# Patient Record
Sex: Female | Born: 1975 | Race: White | Hispanic: No | Marital: Married | Smoking: Current some day smoker
Health system: Southern US, Community
[De-identification: ages and names within clinical notes are randomized; demographics above are authoritative.]

---

## 2002-10-29 ENCOUNTER — Other Ambulatory Visit: Admission: RE | Admit: 2002-10-29 | Discharge: 2002-10-29 | Payer: Self-pay | Admitting: Obstetrics and Gynecology

## 2003-11-08 ENCOUNTER — Other Ambulatory Visit: Admission: RE | Admit: 2003-11-08 | Discharge: 2003-11-08 | Payer: Self-pay | Admitting: Obstetrics and Gynecology

## 2004-06-23 ENCOUNTER — Inpatient Hospital Stay (HOSPITAL_COMMUNITY): Admission: AD | Admit: 2004-06-23 | Discharge: 2004-06-23 | Payer: Self-pay | Admitting: *Deleted

## 2004-07-18 ENCOUNTER — Inpatient Hospital Stay (HOSPITAL_COMMUNITY): Admission: AD | Admit: 2004-07-18 | Discharge: 2004-07-21 | Payer: Self-pay | Admitting: Pediatrics

## 2004-09-05 ENCOUNTER — Other Ambulatory Visit: Admission: RE | Admit: 2004-09-05 | Discharge: 2004-09-05 | Payer: Self-pay | Admitting: Obstetrics and Gynecology

## 2005-11-12 ENCOUNTER — Other Ambulatory Visit: Admission: RE | Admit: 2005-11-12 | Discharge: 2005-11-12 | Payer: Self-pay | Admitting: Obstetrics and Gynecology

## 2008-03-10 ENCOUNTER — Inpatient Hospital Stay (HOSPITAL_COMMUNITY): Admission: AD | Admit: 2008-03-10 | Discharge: 2008-03-10 | Payer: Self-pay | Admitting: Obstetrics and Gynecology

## 2008-03-16 ENCOUNTER — Inpatient Hospital Stay (HOSPITAL_COMMUNITY): Admission: AD | Admit: 2008-03-16 | Discharge: 2008-03-16 | Payer: Self-pay | Admitting: Obstetrics and Gynecology

## 2008-03-22 ENCOUNTER — Inpatient Hospital Stay (HOSPITAL_COMMUNITY): Admission: AD | Admit: 2008-03-22 | Discharge: 2008-03-24 | Payer: Self-pay | Admitting: Obstetrics and Gynecology

## 2011-03-06 NOTE — H&P (Signed)
Lori Hill, Lori Hill                ACCOUNT NO.:  [id number]   MEDICAL RECORD NO.:  [id number]          PATIENT TYPE:  INP   LOCATION:  9166                          FACILITY:  [facility]   PHYSICIAN:  Duke Salvia. Marcelle Overlie, M.D.DATE OF BIRTH:  1976/02/07   DATE OF ADMISSION:  03/22/2008  DATE OF DISCHARGE:                              HISTORY & PHYSICAL   CHIEF COMPLAINT:  For labor induction.   HISTORY OF PRESENT ILLNESS:  a 35 year old G2, P 1-0-0-1, EDD is April 16, 2008, EGA at 36-4/7 weeks.  The patient was seen by Dr. Rana Snare in the  office earlier today and was noted to have BP 148/100, recheck 140/94.  Urine was negative for protein.  She was complaining of edema, NST was  reactive.  Dr. Rana Snare has admitted her for labor induction for her blood  pressure.   GBS is positive.  She is not currently on any medications except for  prenatal vitamins.  She does have a history of PIH with her first  pregnancy.  Blood type is A+.  Rubella titer is immune.   PAST MEDICAL HISTORY:  Please see the [facility] for details.  She is  allergic to SULFA.  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  She has been on Valtrex once daily for prophylaxis.   Her father does have a history of hypertension.   PHYSICAL EXAMINATION:  VITAL SIGNS:  Temperature 98.2, blood pressure  140/94.  HEENT: Unremarkable.  NECK:  Supple, without masses.  LUNGS:  Clear.  CARDIOVASCULAR:  Regular rate and rhythm, without murmurs, rubs, or  gallops.  BREASTS:  Not examined.  ABDOMEN:  37 cm fundal height.  Fetal heart rate 140.  PELVIC:  Cervix was 2, 25%, vertex high.  EXTREMITIES:  Revealed 2+ edema.   IMPRESSION:  1. 36-4/7 week pregnancy.  2. Pregnancy-induced hypertension.   PLAN:  Admit for induction per Dr. Rana Snare.      Richard M. Marcelle Overlie, M.D.  Electronically Signed     [facility]/MEDQ  D:  03/22/2008  T:  03/22/2008  Job:  [id number]

## 2011-03-06 NOTE — H&P (Signed)
Lori Hill, Lori Hill                ACCOUNT NO.:  [id number]   MEDICAL RECORD NO.:  [id number]          PATIENT TYPE:  INP   LOCATION:  9166                          FACILITY:  [facility]   PHYSICIAN:  Duke Salvia. Marcelle Overlie, M.D.DATE OF BIRTH:  1975-12-12   DATE OF ADMISSION:  03/22/2008  DATE OF DISCHARGE:                              HISTORY & PHYSICAL   Dictation ended at this point.      Richard M. Marcelle Overlie, M.D.     [facility]/MEDQ  D:  03/22/2008  T:  03/22/2008  Job:  [id number]

## 2011-07-18 LAB — CBC
HCT: 30.4 — ABNORMAL LOW
Hemoglobin: 11 — ABNORMAL LOW
MCHC: 34.8
Platelets: 276
Platelets: 301
RBC: 3.42 — ABNORMAL LOW
RDW: 13.8

## 2011-07-18 LAB — COMPREHENSIVE METABOLIC PANEL
ALT: 19
AST: 19
Albumin: 2.2 — ABNORMAL LOW
Alkaline Phosphatase: 192 — ABNORMAL HIGH
BUN: 4 — ABNORMAL LOW
BUN: 5 — ABNORMAL LOW
CO2: 24
Calcium: 8.8
Creatinine, Ser: 0.45
GFR calc Af Amer: >60
GFR calc Af Amer: >60
GFR calc non Af Amer: >60
GFR calc non Af Amer: >60
Potassium: 3 — ABNORMAL LOW
Total Bilirubin: 0.3
Total Bilirubin: 0.3

## 2011-07-18 LAB — URINALYSIS, ROUTINE W REFLEX MICROSCOPIC
Nitrite: NEGATIVE
Protein, ur: NEGATIVE
Specific Gravity, Urine: 1.01
Urobilinogen, UA: 0.2

## 2011-07-18 LAB — URIC ACID: Uric Acid, Serum: 3.6

## 2011-07-18 LAB — LACTATE DEHYDROGENASE
LDH: 156
LDH: 162

## 2011-07-18 LAB — URINE MICROSCOPIC-ADD ON

## 2011-07-19 LAB — CBC
HCT: 32.2 — ABNORMAL LOW
MCHC: 34.7
MCHC: 35.2
MCV: 89.8
RBC: 3.08 — ABNORMAL LOW
RBC: 3.61 — ABNORMAL LOW
RDW: 13.9
WBC: 10

## 2011-07-19 LAB — COMPREHENSIVE METABOLIC PANEL
Albumin: 2.3 — ABNORMAL LOW
CO2: 23
Chloride: 106
Sodium: 137
Total Protein: 5.5 — ABNORMAL LOW

## 2011-07-19 LAB — LACTATE DEHYDROGENASE: LDH: 183

## 2012-11-02 ENCOUNTER — Emergency Department (HOSPITAL_COMMUNITY)
Admission: EM | Admit: 2012-11-02 | Discharge: 2012-11-02 | Disposition: A | Discharge status: Home / Self Care | Payer: BC Managed Care – PPO | Attending: Emergency Medicine | Admitting: Emergency Medicine

## 2012-11-02 ENCOUNTER — Encounter (HOSPITAL_COMMUNITY): Payer: Self-pay | Admitting: Emergency Medicine

## 2012-11-02 ENCOUNTER — Emergency Department (HOSPITAL_COMMUNITY): Payer: BC Managed Care – PPO

## 2012-11-02 DIAGNOSIS — Z3202 Encounter for pregnancy test, result negative: Secondary | ICD-10-CM | POA: Insufficient documentation

## 2012-11-02 DIAGNOSIS — Z79899 Other long term (current) drug therapy: Secondary | ICD-10-CM | POA: Insufficient documentation

## 2012-11-02 DIAGNOSIS — N201 Calculus of ureter: Secondary | ICD-10-CM | POA: Insufficient documentation

## 2012-11-02 DIAGNOSIS — N23 Unspecified renal colic: Secondary | ICD-10-CM | POA: Insufficient documentation

## 2012-11-02 DIAGNOSIS — R112 Nausea with vomiting, unspecified: Secondary | ICD-10-CM | POA: Insufficient documentation

## 2012-11-02 DIAGNOSIS — F17200 Nicotine dependence, unspecified, uncomplicated: Secondary | ICD-10-CM | POA: Insufficient documentation

## 2012-11-02 LAB — COMPREHENSIVE METABOLIC PANEL
Alkaline Phosphatase: 70 U/L (ref 39–117)
Chloride: 106 mEq/L (ref 96–112)
GFR calc Af Amer: >90 mL/min (ref >90)
Sodium: 141 mEq/L (ref 135–145)
Total Protein: 7 g/dL (ref 6.0–8.3)

## 2012-11-02 LAB — CBC WITH DIFFERENTIAL/PLATELET
Basophils Relative: 0 % (ref 0–1)
Eosinophils Absolute: 0 10*3/uL (ref 0.0–0.7)
Hemoglobin: 13.8 g/dL (ref 12.0–15.0)
Lymphocytes Relative: 9 % — ABNORMAL LOW (ref 12–46)
MCHC: 35.6 g/dL (ref 30.0–36.0)
MCV: 88 fL (ref 78.0–100.0)
Monocytes Absolute: 0.4 10*3/uL (ref 0.1–1.0)
Monocytes Relative: 3 % (ref 3–12)
Neutro Abs: 9.8 10*3/uL — ABNORMAL HIGH (ref 1.7–7.7)
Platelets: 313 10*3/uL (ref 150–400)
RBC: 4.41 MIL/uL (ref 3.87–5.11)
RDW: 11.6 % (ref 11.5–15.5)

## 2012-11-02 LAB — URINALYSIS, ROUTINE W REFLEX MICROSCOPIC
Ketones, ur: 40 mg/dL — AB
Nitrite: NEGATIVE
Protein, ur: 30 mg/dL — AB
Urobilinogen, UA: 1 mg/dL (ref 0.0–1.0)

## 2012-11-02 LAB — URINE MICROSCOPIC-ADD ON

## 2012-11-02 LAB — LIPASE, BLOOD: Lipase: 26 U/L (ref 11–59)

## 2012-11-02 MED ORDER — OXYCODONE-ACETAMINOPHEN 5-325 MG PO TABS
2.0000 | ORAL_TABLET | Freq: Once | ORAL | Status: AC
  Administered 2012-11-02: 2 via ORAL
  Filled 2012-11-02: qty 2

## 2012-11-02 MED ORDER — ONDANSETRON 4 MG PO TBDP: 4.0000 mg | ORAL_TABLET | Freq: Three times a day (TID) | ORAL | Status: DC | PRN

## 2012-11-02 MED ORDER — MORPHINE SULFATE 4 MG/ML IJ SOLN
4.0000 mg | INTRAMUSCULAR | Status: DC | PRN
  Administered 2012-11-02: 4 mg via INTRAVENOUS
  Filled 2012-11-02: qty 1

## 2012-11-02 MED ORDER — ONDANSETRON HCL 4 MG/2ML IJ SOLN
4.0000 mg | INTRAMUSCULAR | Status: AC | PRN
  Administered 2012-11-02 (×2): 4 mg via INTRAVENOUS
  Filled 2012-11-02 (×2): qty 2

## 2012-11-02 MED ORDER — MORPHINE SULFATE 4 MG/ML IJ SOLN
4.0000 mg | INTRAMUSCULAR | Status: AC | PRN
  Administered 2012-11-02 (×2): 4 mg via INTRAVENOUS
  Filled 2012-11-02 (×2): qty 1

## 2012-11-02 MED ORDER — SODIUM CHLORIDE 0.9 % IV SOLN
INTRAVENOUS | Status: DC
  Administered 2012-11-02: 11:00:00 via INTRAVENOUS

## 2012-11-02 MED ORDER — NAPROXEN 250 MG PO TABS: 250.0000 mg | ORAL_TABLET | Freq: Two times a day (BID) | ORAL | Status: DC

## 2012-11-02 MED ORDER — KETOROLAC TROMETHAMINE 30 MG/ML IJ SOLN
30.0000 mg | Freq: Once | INTRAMUSCULAR | Status: AC
  Administered 2012-11-02: 30 mg via INTRAVENOUS
  Filled 2012-11-02: qty 1

## 2012-11-02 MED ORDER — OXYCODONE-ACETAMINOPHEN 5-325 MG PO TABS: ORAL_TABLET | ORAL | Status: DC

## 2012-11-02 MED ORDER — TAMSULOSIN HCL 0.4 MG PO CAPS: 0.4000 mg | ORAL_CAPSULE | Freq: Every day | ORAL | Status: DC

## 2012-11-02 NOTE — ED Notes (Signed)
Pt c/o right flank pain with n/v onset this morning. Pt has been dry heaving on the ride here.

## 2012-11-02 NOTE — ED Provider Notes (Signed)
History     CSN: [id number]  Arrival date & time 11/02/12  4098   First MD Initiated Contact with Patient 11/02/12 1003      Chief Complaint  Patient presents with  . Flank Pain  . Nausea  . Emesis    HPI Pt was seen at 1020.   Per pt, c/o sudden onset and persistence of constant right sided flank "pain" that began this morning PTA.  Has been associated with several intermittent episodes of N/V.  Denies hx of same. Denies abd pain, no diarrhea, no dysuria/hematuria, no vaginal bleeding/discharge, no CP/SOB, no fevers.    History reviewed. No pertinent past medical history.  History reviewed. No pertinent past surgical history.   History  Substance Use Topics  . Smoking status: Current Some Day Smoker  . Smokeless tobacco: Not on file  . Alcohol Use: Yes    Review of Systems ROS: Statement: All systems negative except as marked or noted in the HPI; Constitutional: Negative for fever and chills. ; ; Eyes: Negative for eye pain, redness and discharge. ; ; ENMT: Negative for ear pain, hoarseness, nasal congestion, sinus pressure and sore throat. ; ; Cardiovascular: Negative for chest pain, palpitations, diaphoresis, dyspnea and peripheral edema. ; ; Respiratory: Negative for cough, wheezing and stridor. ; ; Gastrointestinal: +N/V. Negative for diarrhea, abdominal pain, blood in stool, hematemesis, jaundice and rectal bleeding. . ; ; Genitourinary: +flank pain. Negative for dysuria and hematuria. ; ; GYN:  No vaginal bleeding, no vaginal discharge, no vulvar pain.;; Musculoskeletal: Negative for back pain and neck pain. Negative for swelling and trauma.; ; Skin: Negative for pruritus, rash, abrasions, blisters, bruising and skin lesion.; ; Neuro: Negative for headache, lightheadedness and neck stiffness. Negative for weakness, altered level of consciousness , altered mental status, extremity weakness, paresthesias, involuntary movement, seizure and syncope.       Allergies  Review  of patient's allergies indicates not on file.  Home Medications  No current outpatient prescriptions on file.  BP 144/86  Pulse 77  Temp 97.9 F (36.6 C) (Oral)  Resp 24  SpO2 98%  Physical Exam 1025:  Physical examination:  Nursing notes reviewed; Vital signs and O2 SAT reviewed;  Constitutional: Well developed, Well nourished, Well hydrated, Uncomfortable appearing; Head:  Normocephalic, atraumatic; Eyes: EOMI, PERRL, No scleral icterus; ENMT: Mouth and pharynx normal, Mucous membranes moist; Neck: Supple, Full range of motion, No lymphadenopathy; Cardiovascular: Regular rate and rhythm, No murmur, rub, or gallop; Respiratory: +hyperventilating. Breath sounds clear & equal bilaterally, No rales, rhonchi, wheezes.  Speaking full sentences with ease, No resp distress.; Chest: Nontender, Movement normal; Abdomen: +dry heaving during exam. Soft, Nontender, Nondistended, Normal bowel sounds; Genitourinary: No CVA tenderness; Spine:  No midline CS, TS, LS tenderness. No rash;; Extremities: Pulses normal, No tenderness, No edema, No calf edema or asymmetry.; Neuro: AA&Ox3, Major CN grossly intact.  Speech clear. No gross focal motor or sensory deficits in extremities.; Skin: Color normal, Warm, Dry.    ED Course  Procedures     MDM  MDM Reviewed: nursing note and vitals Interpretation: labs and CT scan   Results for orders placed during the hospital encounter of 11/02/12  COMPREHENSIVE METABOLIC PANEL      Component Value Range   Sodium 141  135 - 145 mEq/L   Potassium 3.7  3.5 - 5.1 mEq/L   Chloride 106  96 - 112 mEq/L   CO2 18 (*) 19 - 32 mEq/L   Glucose, Bld 124 (*) 70 -  99 mg/dL   BUN 19  6 - 23 mg/dL   Creatinine, Ser 1.61  0.50 - 1.10 mg/dL   Calcium 9.9  8.4 - 09.6 mg/dL   Total Protein 7.0  6.0 - 8.3 g/dL   Albumin 4.2  3.5 - 5.2 g/dL   AST 19  0 - 37 U/L   ALT 21  0 - 35 U/L   Alkaline Phosphatase 70  39 - 117 U/L   Total Bilirubin 0.5  0.3 - 1.2 mg/dL   GFR calc non  Af Amer >90  >90 mL/min   GFR calc Af Amer >90  >90 mL/min  LIPASE, BLOOD      Component Value Range   Lipase 26  11 - 59 U/L  CBC WITH DIFFERENTIAL      Component Value Range   WBC 11.3 (*) 4.0 - 10.5 K/uL   RBC 4.41  3.87 - 5.11 MIL/uL   Hemoglobin 13.8  12.0 - 15.0 g/dL   HCT 04.5  40.9 - 81.1 %   MCV 88.0  78.0 - 100.0 fL   MCH 31.3  26.0 - 34.0 pg   MCHC 35.6  30.0 - 36.0 g/dL   RDW 91.4  78.2 - 95.6 %   Platelets 313  150 - 400 K/uL   Neutrophils Relative 87 (*) 43 - 77 %   Neutro Abs 9.8 (*) 1.7 - 7.7 K/uL   Lymphocytes Relative 9 (*) 12 - 46 %   Lymphs Abs 1.0  0.7 - 4.0 K/uL   Monocytes Relative 3  3 - 12 %   Monocytes Absolute 0.4  0.1 - 1.0 K/uL   Eosinophils Relative 0  0 - 5 %   Eosinophils Absolute 0.0  0.0 - 0.7 K/uL   Basophils Relative 0  0 - 1 %   Basophils Absolute 0.0  0.0 - 0.1 K/uL  URINALYSIS, ROUTINE W REFLEX MICROSCOPIC      Component Value Range   Color, Urine Jamice (*) YELLOW   APPearance CLOUDY (*) CLEAR   Specific Gravity, Urine 1.031 (*) 1.005 - 1.030   pH 8.0  5.0 - 8.0   Glucose, UA NEGATIVE  NEGATIVE mg/dL   Hgb urine dipstick LARGE (*) NEGATIVE   Bilirubin Urine NEGATIVE  NEGATIVE   Ketones, ur 40 (*) NEGATIVE mg/dL   Protein, ur 30 (*) NEGATIVE mg/dL   Urobilinogen, UA 1.0  0.0 - 1.0 mg/dL   Nitrite NEGATIVE  NEGATIVE   Leukocytes, UA SMALL (*) NEGATIVE  PREGNANCY, URINE      Component Value Range   Preg Test, Ur NEGATIVE  NEGATIVE  URINE MICROSCOPIC-ADD ON      Component Value Range   Squamous Epithelial / LPF MANY (*) RARE   WBC, UA 3-6  <3 WBC/hpf   RBC / HPF TOO NUMEROUS TO COUNT  <3 RBC/hpf   Bacteria, UA FEW (*) RARE   Ct Abdomen Pelvis Wo Contrast 11/02/2012  *RADIOLOGY REPORT*  Clinical Data: Right flank pain, nausea, vomiting  CT ABDOMEN AND PELVIS WITHOUT CONTRAST  Technique:  Multidetector CT imaging of the abdomen and pelvis was performed following the standard protocol without intravenous contrast.  Comparison: None.   Findings: Minimal dependent basilar atelectasis.  Normal heart size.  No pericardial or pleural effusion.  Abdomen: Moderate right hydronephrosis and proximal hydroureter. Perinephric inflammatory stranding noted about the right kidney and a small amount of peri nephric free fluid.  This appears secondary to 5 mm right distal ureteral obstructing calculus just  above the right UVJ, image 70.  This is also confirmed on the coronal image, image 71.  Left kidney demonstrates no acute process.  Punctate nonobstructing tiny renal calculi noted throughout the left kidney.  Liver, gallbladder, biliary system, pancreas, spleen, and adrenal glands are within normal limits for noncontrast exam.  Negative for bowel obstruction, dilatation, ileus pattern, or free air.  No abdominal free fluid, fluid collection, hemorrhage, hematoma, or adenopathy.  Pelvis:  Trace pelvic free fluid.  IUD noted within a retroverted uterus.  Low lying cecum noted extending into the pelvis.  No pelvic hemorrhage, abscess, adenopathy, inguinal abnormality, and or acute distal bowel process.  No acute osseous finding.  IMPRESSION: 5 mm right distal ureteral obstructing calculus with associated proximal moderate right hydronephrosis.  Punctate nonobstructing left nephrolithiasis   Original Report Authenticated By: Judie Petit. Shick, M.D.     1445:  Pt states she feels "much better now" and wants to go home.  No further N/V after meds.  Has tol PO well.  Dx and testing d/w pt and family.  Questions answered.  Verb understanding, agreeable to d/c home with outpt f/u.         No midlevel was involved in the care of this pt. Woodrow Drab Allison Quarry, DO 11/03/12 1245

## 2012-11-03 LAB — URINE CULTURE: Colony Count: >=100000

## 2013-08-05 IMAGING — CT CT ABD-PELV W/O CM
2 of 4 series · 17 of 46 positions shown, 19 images · non-contrast
Comparison: None.

CLINICAL DATA: Right flank pain, nausea, vomiting

CT ABDOMEN AND PELVIS WITHOUT CONTRAST
TECHNIQUE: Multidetector CT imaging of the abdomen and pelvis was
performed following the standard protocol without intravenous
contrast.

[Series 2: stone >200 lbs 5.0 b31f st · axial · 0.69mm/px · z∈[-417,-37]mm · 14 of 84 slices shown, 16 images]
[im 4/84  soft-tissue]
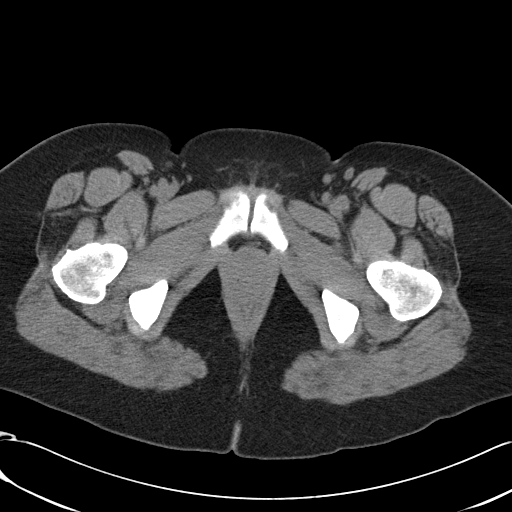
[im 4/84  bone]
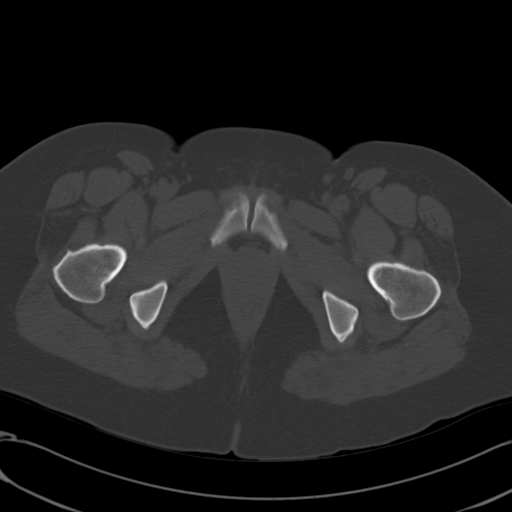
[im 10/84  soft-tissue]
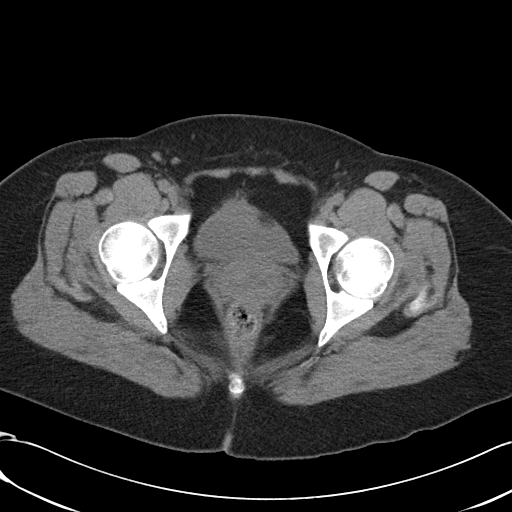
[im 17/84  soft-tissue]
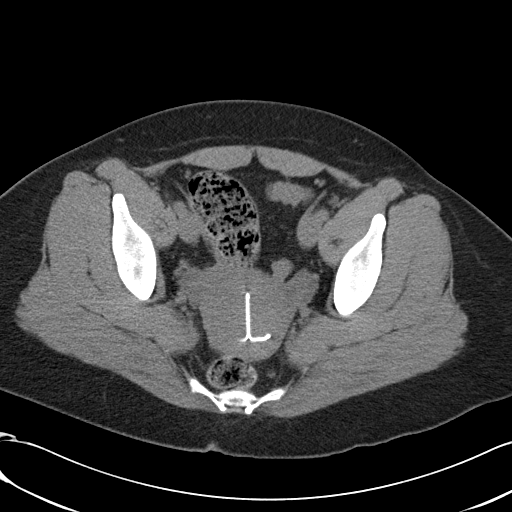
[im 24/84  soft-tissue]
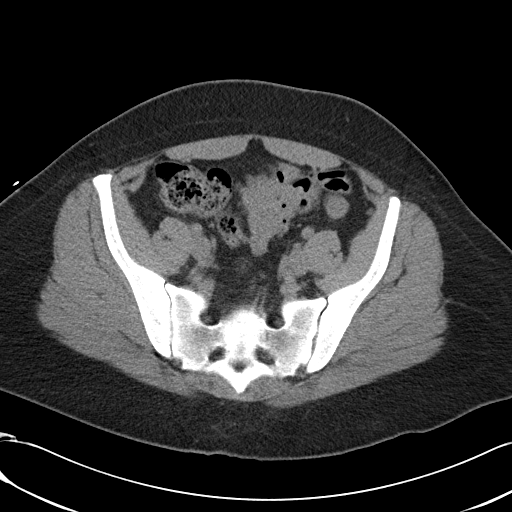
[im 27/84  soft-tissue]
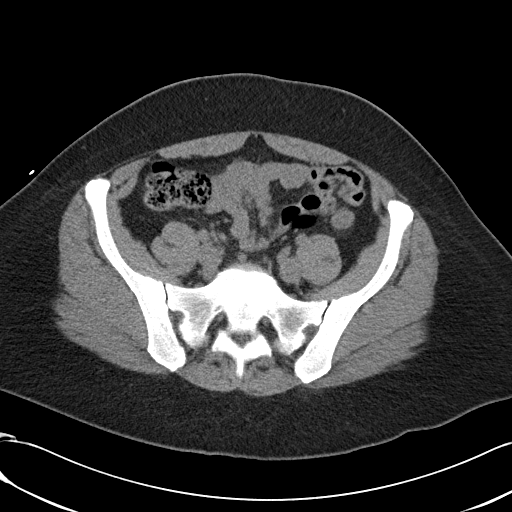
[im 34/84  soft-tissue]
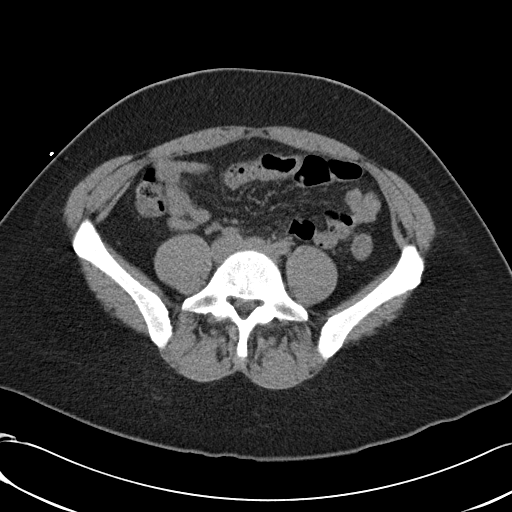
[im 40/84  soft-tissue]
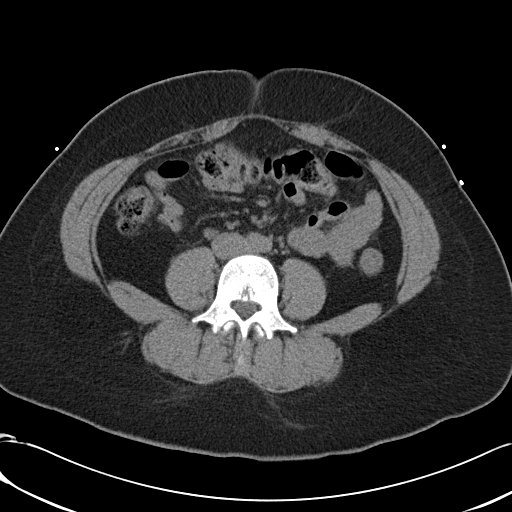
[im 44/84  soft-tissue]
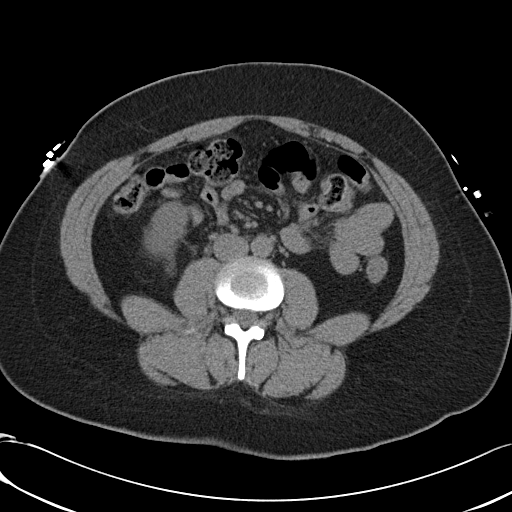
[im 50/84  soft-tissue]
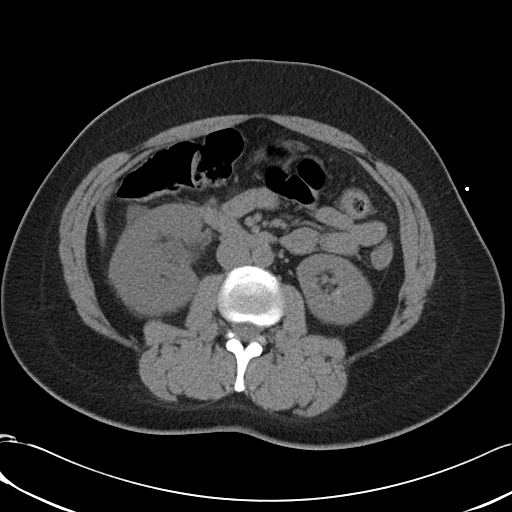
[im 50/84  bone]
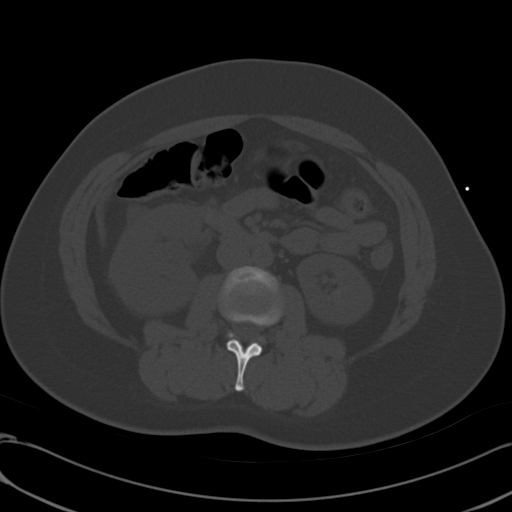
[im 57/84  soft-tissue]
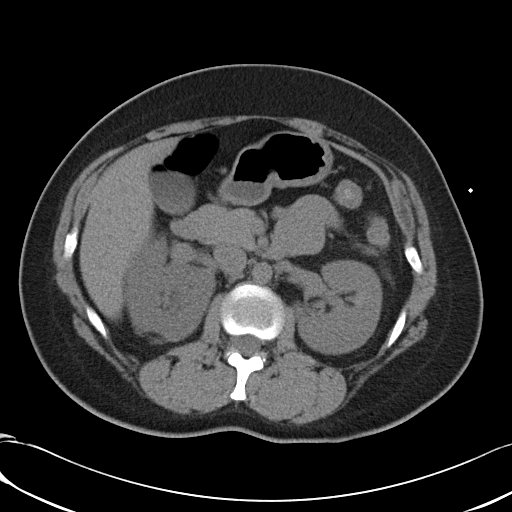
[im 64/84  soft-tissue]
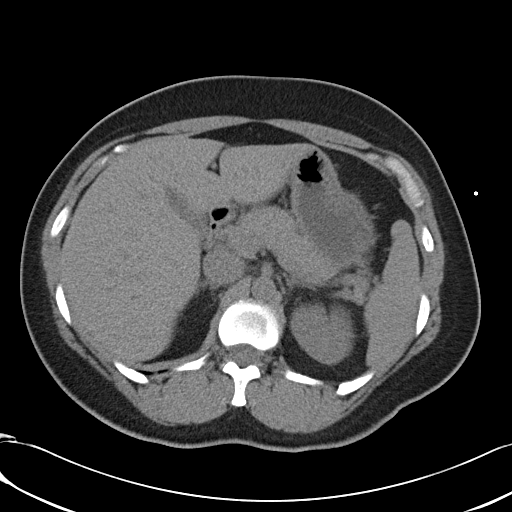
[im 67/84  soft-tissue]
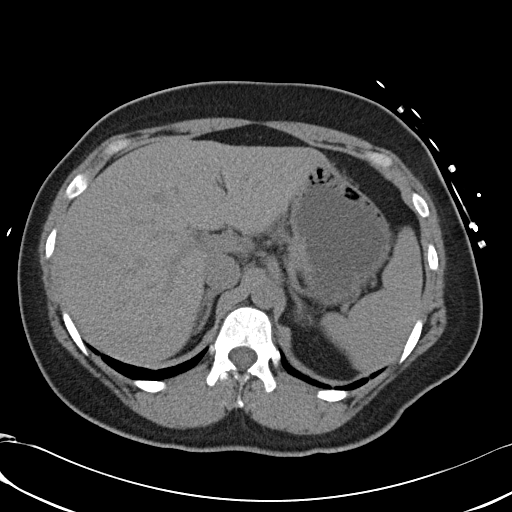
[im 74/84  soft-tissue]
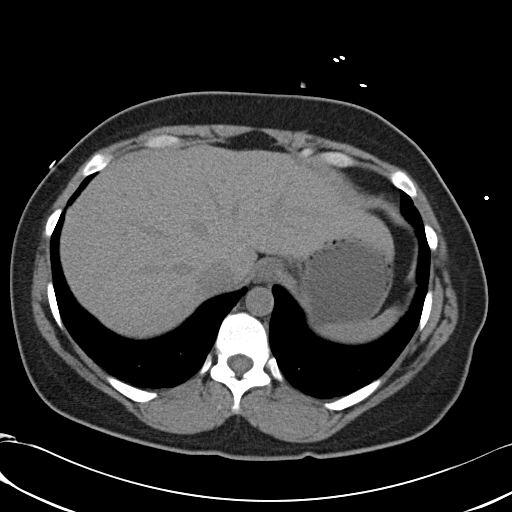
[im 80/84  soft-tissue]
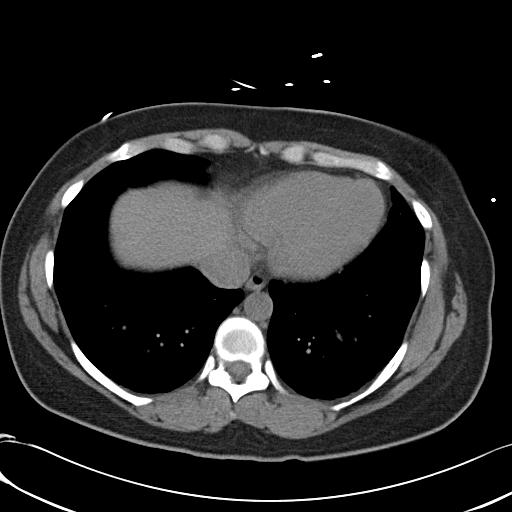

[Series 5: coronals · coronal · 0.91mm/px · 3 of 119 slices shown]
[im 40/119  soft-tissue]
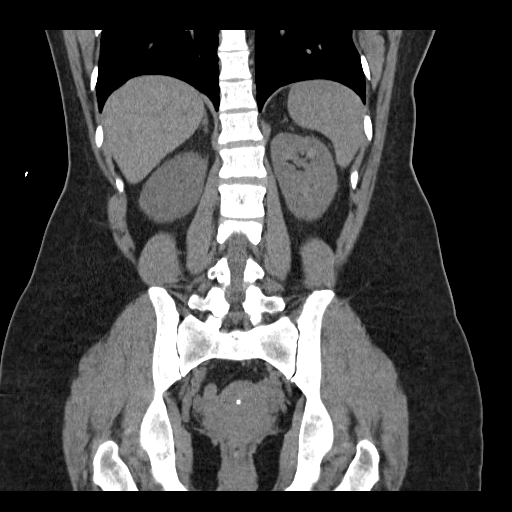
[im 53/119  soft-tissue]
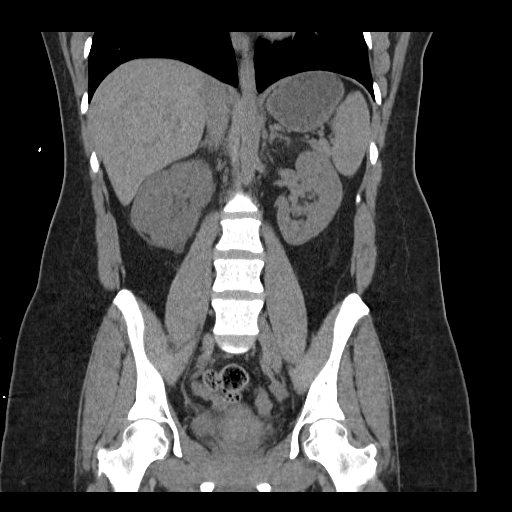
[im 66/119  soft-tissue]
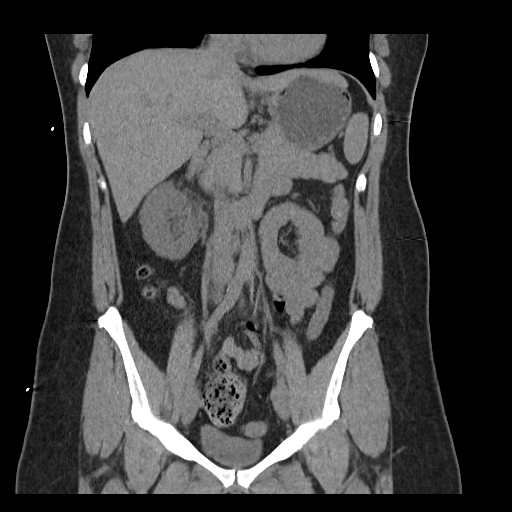

[17 of 46 positions shown; findings below may reference images not displayed]

FINDINGS: Minimal dependent basilar atelectasis.  Normal heart
size.  No pericardial or pleural effusion.

Abdomen: Moderate right hydronephrosis and proximal hydroureter.
Perinephric inflammatory stranding noted about the right kidney and
a small amount of peri nephric free fluid.  This appears secondary
to 5 mm right distal ureteral obstructing calculus just above the
right UVJ, image 70.  This is also confirmed on the coronal image,
image 71.

Left kidney demonstrates no acute process.  Punctate nonobstructing
tiny renal calculi noted throughout the left kidney.

Liver, gallbladder, biliary system, pancreas, spleen, and adrenal
glands are within normal limits for noncontrast exam.

Negative for bowel obstruction, dilatation, ileus pattern, or free
air.

No abdominal free fluid, fluid collection, hemorrhage, hematoma, or
adenopathy.

Pelvis:  Trace pelvic free fluid.  IUD noted within a retroverted
uterus.  Low lying cecum noted extending into the pelvis.  No
pelvic hemorrhage, abscess, adenopathy, inguinal abnormality, and
or acute distal bowel process.

No acute osseous finding.
IMPRESSION: 5 mm right distal ureteral obstructing calculus with associated
proximal moderate right hydronephrosis.

Punctate nonobstructing left nephrolithiasis

## 2016-05-04 ENCOUNTER — Encounter: Payer: Self-pay | Admitting: Podiatry

## 2016-05-04 ENCOUNTER — Ambulatory Visit (INDEPENDENT_AMBULATORY_CARE_PROVIDER_SITE_OTHER): Payer: BLUE CROSS/BLUE SHIELD | Admitting: Podiatry

## 2016-05-04 ENCOUNTER — Ambulatory Visit (INDEPENDENT_AMBULATORY_CARE_PROVIDER_SITE_OTHER): Payer: BLUE CROSS/BLUE SHIELD

## 2016-05-04 VITALS — BP 131/82 | HR 78 | Resp 12

## 2016-05-04 DIAGNOSIS — S93401S Sprain of unspecified ligament of right ankle, sequela: Secondary | ICD-10-CM | POA: Diagnosis not present

## 2016-05-04 DIAGNOSIS — M79671 Pain in right foot: Secondary | ICD-10-CM

## 2016-05-04 MED ORDER — MELOXICAM 15 MG PO TABS: 15.0000 mg | ORAL_TABLET | Freq: Every day | ORAL | Status: DC

## 2016-05-04 NOTE — Patient Instructions (Signed)

## 2016-05-04 NOTE — Progress Notes (Signed)
   Subjective:    Patient ID: Lori Hill, female    DOB: 16-Feb-1976, 40 y.o.   MRN: [id number]  HPI Chief Complaint  Patient presents with  . Foot Pain    RT FOOT ANKLE IS BEEN PAINFUL/TENDER  FOR 6 MONTHS. ANKLE IS WORSE/SORE WHEN PUTTING PRESSURE. TRIED NO TREATMENT.    40 year old female presents the also concerns for right ankle pain which is been ongoing for about 6 months as worse as she tries to put a lot of pressure to her foot. She previously had no previous treatment. Back in February she did roll her ankle with walking but she did not seek treatment at that time. She states that it never got better after that time. It does swell occasionally but denies any redness or any warmth. Denies any redness or tingling. The pain does not wake up in 9. No other complaints at this time.  Review of Systems  Musculoskeletal: Positive for gait problem.       Objective:   Physical Exam General: AAO x3, NAD  Dermatological: Skin is warm, dry and supple bilateral. Nails x 10 are well manicured; remaining integument appears unremarkable at this time. There are no open sores, no preulcerative lesions, no rash or signs of infection present.  Vascular: Dorsalis Pedis artery and Posterior Tibial artery pedal pulses are 2/4 bilateral with immedate capillary fill time. Pedal hair growth present. There is no pain with calf compression, swelling, warmth, erythema.   Neruologic: Grossly intact via light touch bilateral. Vibratory intact via tuning fork bilateral.Patellar and Achilles deep tendon reflexes 2+ bilateral. No Babinski or clonus noted bilateral.   Musculoskeletal:There is tenderness on the course the ATFL and CFL the right ankle. There is no gross instability noted of the ankle. There is no area pinpoint bony tenderness. There is no pain with ankle or subtalar joint range of motion. There does appear to be some chronic edema overlying the course the ATFL any significant erythema or increase  in warmth. There is no other areas of tenderness bilaterally. MMT 5/5.    Assessment: 40 year old female right chronic ankle sprain  Plan: -Treatment options discussed including all alternatives, risks, and complications -Etiology of symptoms were discussed -X-rays were obtained and reviewed with the patient. No evidence of acute fracture or stress fracture. -Prescribed mobic. Discussed side effects of the medication and directed to stop if any are to occur and call the office.  -Dispensed ankle brace -Rehabilitation exercises for ankle sprain -Follow-up in 4 weeks. The time of symptoms continue will likely perform a steroid injection. In the meantime call any questions or concerns or any change in symptoms.  Ovid CurdMatthew Wagoner, DPM

## 2016-05-17 DIAGNOSIS — S93409A Sprain of unspecified ligament of unspecified ankle, initial encounter: Secondary | ICD-10-CM | POA: Insufficient documentation

## 2016-06-08 ENCOUNTER — Ambulatory Visit: Payer: BLUE CROSS/BLUE SHIELD | Admitting: Podiatry

## 2016-06-18 ENCOUNTER — Ambulatory Visit (INDEPENDENT_AMBULATORY_CARE_PROVIDER_SITE_OTHER): Payer: BLUE CROSS/BLUE SHIELD | Admitting: Podiatry

## 2016-06-18 ENCOUNTER — Encounter: Payer: Self-pay | Admitting: Podiatry

## 2016-06-18 ENCOUNTER — Telehealth: Payer: Self-pay | Admitting: *Deleted

## 2016-06-18 DIAGNOSIS — M79671 Pain in right foot: Secondary | ICD-10-CM

## 2016-06-18 DIAGNOSIS — S93401S Sprain of unspecified ligament of right ankle, sequela: Secondary | ICD-10-CM

## 2016-06-18 NOTE — Telephone Encounter (Signed)
[phone]

## 2016-06-18 NOTE — Progress Notes (Signed)
Subjective: 40 year old female presents the office for follow-up evaluation of right ankle pain. She states that the pain has improved although does continue to swell and become painful when she does not wear the ankle brace. This been ongoing for several months and she had a sprain but 6 months prior to coming to the office. Denies any systemic complaints such as fevers, chills, nausea, vomiting. No acute changes since last appointment, and no other complaints at this time.   Objective: AAO x3, NAD DP/PT pulses palpable bilaterally, CRT less than 3 seconds There is tenderness along the lateral aspect of the ankle on the course the ATFL and CFL and to a lesser degree the pain nail tendon. There is no gross ankle instability present. There is no area pinpoint bony tenderness or pain the vibratory sensation. Ankle, subtalar joint range of motion intact. No edema, erythema, increase in warmth to bilateral lower extremities.  No open lesions or pre-ulcerative lesions.  No pain with calf compression, swelling, warmth, erythema  Assessment: Chronic ankle sprain right side  Plan: -All treatment options discussed with the patient including all alternatives, risks, complications.  -At this time given the continued pain and swelling recommended physical therapy. This was prescribed to her today. Continue anti-inflammatories which she has not been taking. Continue ankle brace as needed. Monitor pain subsides to decrease wearing the ankle brace. If symptoms continue despite physical therapy will obtain an MRI. In the meantime call any questions or concerns. See her after physical therapy.   Ovid CurdMatthew Wagoner, DPM

## 2017-02-01 ENCOUNTER — Ambulatory Visit (INDEPENDENT_AMBULATORY_CARE_PROVIDER_SITE_OTHER): Payer: BLUE CROSS/BLUE SHIELD

## 2017-02-01 ENCOUNTER — Ambulatory Visit (INDEPENDENT_AMBULATORY_CARE_PROVIDER_SITE_OTHER): Payer: BLUE CROSS/BLUE SHIELD | Admitting: Podiatry

## 2017-02-01 ENCOUNTER — Encounter: Payer: Self-pay | Admitting: Podiatry

## 2017-02-01 DIAGNOSIS — M722 Plantar fascial fibromatosis: Secondary | ICD-10-CM | POA: Diagnosis not present

## 2017-02-01 MED ORDER — MELOXICAM 15 MG PO TABS: 15.0000 mg | ORAL_TABLET | Freq: Every day | ORAL | 2 refills | Status: AC

## 2017-02-01 NOTE — Patient Instructions (Signed)

## 2017-02-04 NOTE — Progress Notes (Signed)
Subjective: 41 year old female presents the office today for concerns of bilateral heel and the left side worse than the right has been ongoing the last couple months. She states that hurts worse in the morning she first gets up after standing all day. She denies any recent injury or trauma. There is no numbness or tingling. The pain does not wake her up at night. She has been icing without a improvement as well as taking ibuprofen. Denies any systemic complaints such as fevers, chills, nausea, vomiting. No acute changes since last appointment, and no other complaints at this time.   Objective: AAO x3, NAD DP/PT pulses palpable bilaterally, CRT less than 3 seconds Tenderness to palpation along the plantar medial tubercle of the calcaneus at the insertion of plantar fascia on the left >> right foot. There is no pain along the course of the plantar fascia within the arch of the foot. Plantar fascia appears to be intact. There is no pain with lateral compression of the calcaneus or pain with vibratory sensation. There is no pain along the course or insertion of the achilles tendon. No other areas of tenderness to bilateral lower extremities. No open lesions or pre-ulcerative lesions.  No pain with calf compression, swelling, warmth, erythema  Assessment: Bilateral heel pain, plantar fasciitis left fourth and right  Plan: -All treatment options discussed with the patient including all alternatives, risks, complications.  -X-rays obtained and reviewed. Inferior calcaneal spurring left side. No evidence of acute fracture identified. -Patient elects to proceed with steroid injection into the left and right plantar heel. Under sterile skin preparation, a total of 2.5cc of kenalog 10, 0.5% Marcaine plain, and 2% lidocaine plain were infiltrated into the symptomatic area without complication. A band-aid was applied. Patient tolerated the injection well without complication. Post-injection care with discussed  with the patient. Discussed with the patient to ice the area over the next couple of days to help prevent a steroid flare.  -Plantar fascial braces applied. -Stretching and icing -Prescribed mobic. Discussed side effects of the medication and directed to stop if any are to occur and call the office.  -RTC 3 weeks or sooner if needed.  -Patient encouraged to call the office with any questions, concerns, change in symptoms.   Ovid Curd, DPM

## 2017-02-22 ENCOUNTER — Ambulatory Visit: Payer: BLUE CROSS/BLUE SHIELD | Admitting: Podiatry

## 2017-03-07 ENCOUNTER — Encounter: Payer: Self-pay | Admitting: Podiatry

## 2017-03-07 ENCOUNTER — Ambulatory Visit (INDEPENDENT_AMBULATORY_CARE_PROVIDER_SITE_OTHER): Payer: BLUE CROSS/BLUE SHIELD | Admitting: Podiatry

## 2017-03-07 DIAGNOSIS — M722 Plantar fascial fibromatosis: Secondary | ICD-10-CM

## 2017-03-07 MED ORDER — METHYLPREDNISOLONE 4 MG PO TBPK: ORAL_TABLET | ORAL | 0 refills | Status: DC

## 2017-03-07 MED ORDER — TRIAMCINOLONE ACETONIDE 10 MG/ML IJ SUSP
10.0000 mg | Freq: Once | INTRAMUSCULAR | Status: AC
  Administered 2017-03-08: 10 mg

## 2017-03-07 NOTE — Patient Instructions (Addendum)
We are going to check insurance coverage for custom inserts. If we cannot get custom we will do an over the counter. I recommended either a Powerstep or Superfeet insert. You can get these at [employer] or [employer].    Plantar Fasciitis (Heel Spur Syndrome) with Rehab The plantar fascia is a fibrous, ligament-like, soft-tissue structure that spans the bottom of the foot. Plantar fasciitis is a condition that causes pain in the foot due to inflammation of the tissue. SYMPTOMS   Pain and tenderness on the underneath side of the foot.  Pain that worsens with standing or walking. CAUSES  Plantar fasciitis is caused by irritation and injury to the plantar fascia on the underneath side of the foot. Common mechanisms of injury include:  Direct trauma to bottom of the foot.  Damage to a small nerve that runs under the foot where the main fascia attaches to the heel bone.  Stress placed on the plantar fascia due to bone spurs. RISK INCREASES WITH:   Activities that place stress on the plantar fascia (running, jumping, pivoting, or cutting).  Poor strength and flexibility.  Improperly fitted shoes.  Tight calf muscles.  Flat feet.  Failure to warm-up properly before activity.  Obesity. PREVENTION  Warm up and stretch properly before activity.  Allow for adequate recovery between workouts.  Maintain physical fitness:  Strength, flexibility, and endurance.  Cardiovascular fitness.  Maintain a health body weight.  Avoid stress on the plantar fascia.  Wear properly fitted shoes, including arch supports for individuals who have flat feet.  PROGNOSIS  If treated properly, then the symptoms of plantar fasciitis usually resolve without surgery. However, occasionally surgery is necessary.  RELATED COMPLICATIONS   Recurrent symptoms that may result in a chronic condition.  Problems of the lower back that are caused by compensating for the injury, such as limping.  Pain or  weakness of the foot during push-off following surgery.  Chronic inflammation, scarring, and partial or complete fascia tear, occurring more often from repeated injections.  TREATMENT  Treatment initially involves the use of ice and medication to help reduce pain and inflammation. The use of strengthening and stretching exercises may help reduce pain with activity, especially stretches of the Achilles tendon. These exercises may be performed at home or with a therapist. Your caregiver may recommend that you use heel cups of arch supports to help reduce stress on the plantar fascia. Occasionally, corticosteroid injections are given to reduce inflammation. If symptoms persist for greater than 6 months despite non-surgical (conservative), then surgery may be recommended.   MEDICATION   If pain medication is necessary, then nonsteroidal anti-inflammatory medications, such as aspirin and ibuprofen, or other minor pain relievers, such as acetaminophen, are often recommended.  Do not take pain medication within 7 days before surgery.  Prescription pain relievers may be given if deemed necessary by your caregiver. Use only as directed and only as much as you need.  Corticosteroid injections may be given by your caregiver. These injections should be reserved for the most serious cases, because they may only be given a certain number of times.  HEAT AND COLD  Cold treatment (icing) relieves pain and reduces inflammation. Cold treatment should be applied for 10 to 15 minutes every 2 to 3 hours for inflammation and pain and immediately after any activity that aggravates your symptoms. Use ice packs or massage the area with a piece of ice (ice massage).  Heat treatment may be used prior to performing the stretching  and strengthening activities prescribed by your caregiver, physical therapist, or athletic trainer. Use a heat pack or soak the injury in warm water.  SEEK IMMEDIATE MEDICAL CARE  IF:  Treatment seems to offer no benefit, or the condition worsens.  Any medications produce adverse side effects.  EXERCISES- RANGE OF MOTION (ROM) AND STRETCHING EXERCISES - Plantar Fasciitis (Heel Spur Syndrome) These exercises may help you when beginning to rehabilitate your injury. Your symptoms may resolve with or without further involvement from your physician, physical therapist or athletic trainer. While completing these exercises, remember:   Restoring tissue flexibility helps normal motion to return to the joints. This allows healthier, less painful movement and activity.  An effective stretch should be held for at least 30 seconds.  A stretch should never be painful. You should only feel a gentle lengthening or release in the stretched tissue.  RANGE OF MOTION - Toe Extension, Flexion  Sit with your right / left leg crossed over your opposite knee.  Grasp your toes and gently pull them back toward the top of your foot. You should feel a stretch on the bottom of your toes and/or foot.  Hold this stretch for 10 seconds.  Now, gently pull your toes toward the bottom of your foot. You should feel a stretch on the top of your toes and or foot.  Hold this stretch for 10 seconds. Repeat  times. Complete this stretch 3 times per day.   RANGE OF MOTION - Ankle Dorsiflexion, Active Assisted  Remove shoes and sit on a chair that is preferably not on a carpeted surface.  Place right / left foot under knee. Extend your opposite leg for support.  Keeping your heel down, slide your right / left foot back toward the chair until you feel a stretch at your ankle or calf. If you do not feel a stretch, slide your bottom forward to the edge of the chair, while still keeping your heel down.  Hold this stretch for 10 seconds. Repeat 3 times. Complete this stretch 2 times per day.   STRETCH  Gastroc, Standing  Place hands on wall.  Extend right / left leg, keeping the front knee  somewhat bent.  Slightly point your toes inward on your back foot.  Keeping your right / left heel on the floor and your knee straight, shift your weight toward the wall, not allowing your back to arch.  You should feel a gentle stretch in the right / left calf. Hold this position for 10 seconds. Repeat 3 times. Complete this stretch 2 times per day.  STRETCH  Soleus, Standing  Place hands on wall.  Extend right / left leg, keeping the other knee somewhat bent.  Slightly point your toes inward on your back foot.  Keep your right / left heel on the floor, bend your back knee, and slightly shift your weight over the back leg so that you feel a gentle stretch deep in your back calf.  Hold this position for 10 seconds. Repeat 3 times. Complete this stretch 2 times per day.  STRETCH  Gastrocsoleus, Standing  Note: This exercise can place a lot of stress on your foot and ankle. Please complete this exercise only if specifically instructed by your caregiver.   Place the ball of your right / left foot on a step, keeping your other foot firmly on the same step.  Hold on to the wall or a rail for balance.  Slowly lift your other foot, allowing your body  weight to press your heel down over the edge of the step.  You should feel a stretch in your right / left calf.  Hold this position for 10 seconds.  Repeat this exercise with a slight bend in your right / left knee. Repeat 3 times. Complete this stretch 2 times per day.   STRENGTHENING EXERCISES - Plantar Fasciitis (Heel Spur Syndrome)  These exercises may help you when beginning to rehabilitate your injury. They may resolve your symptoms with or without further involvement from your physician, physical therapist or athletic trainer. While completing these exercises, remember:   Muscles can gain both the endurance and the strength needed for everyday activities through controlled exercises.  Complete these exercises as instructed by  your physician, physical therapist or athletic trainer. Progress the resistance and repetitions only as guided.  STRENGTH - Towel Curls  Sit in a chair positioned on a non-carpeted surface.  Place your foot on a towel, keeping your heel on the floor.  Pull the towel toward your heel by only curling your toes. Keep your heel on the floor. Repeat 3 times. Complete this exercise 2 times per day.  STRENGTH - Ankle Inversion  Secure one end of a rubber exercise band/tubing to a fixed object (table, pole). Loop the other end around your foot just before your toes.  Place your fists between your knees. This will focus your strengthening at your ankle.  Slowly, pull your big toe up and in, making sure the band/tubing is positioned to resist the entire motion.  Hold this position for 10 seconds.  Have your muscles resist the band/tubing as it slowly pulls your foot back to the starting position. Repeat 3 times. Complete this exercises 2 times per day.  Document Released: 10/08/2005 Document Revised: 12/31/2011 Document Reviewed: 01/20/2009 [facility] Patient Information 2014 [place], [place].

## 2017-03-07 NOTE — Progress Notes (Signed)
Subjective: Praxairmber W Sawyers presents to the office today for follow-up evaluation of left heel pain she says the right foot is doing much better but she still having some pain in the bottom of the left heel. She feels that the plantar fascial braces are helpful. She states that the pain comes and goes opinion on activity level. They have been icing, stretching, try to wear supportive shoe as much as possible. No other complaints at this time. No acute changes since last appointment. They deny any systemic complaints such as fevers, chills, nausea, vomiting.  Objective: General: AAO x3, NAD  Dermatological: Skin is warm, dry and supple bilateral. Nails x 10 are well manicured; remaining integument appears unremarkable at this time. There are no open sores, no preulcerative lesions, no rash or signs of infection present.  Vascular: Dorsalis Pedis artery and Posterior Tibial artery pedal pulses are 2/4 bilateral with immedate capillary fill time. Pedal hair growth present. There is no pain with calf compression, swelling, warmth, erythema.   Neruologic: Grossly intact via light touch bilateral. Vibratory intact via tuning fork bilateral. Protective threshold with Semmes Wienstein monofilament intact to all pedal sites bilateral.   Musculoskeletal: There is improved but continued tenderness palpation along the plantar medial tubercle of the calcaneus at the insertion of the plantar fascia on the left foot. There is no pain on the right foot. There is no pain along the course of the plantar fascia within the arch of the foot. Plantar fascia appears to be intact bilaterally. There is no pain with lateral compression of the calcaneus and there is no pain with vibratory sensation. There is no pain along the course or insertion of the Achilles tendon. There are no other areas of tenderness to bilateral lower extremities. No gross boney pedal deformities bilateral. No pain, crepitus, or limitation noted with foot  and ankle range of motion bilateral. Muscular strength 5/5 in all groups tested bilateral.  Gait: Unassisted, Nonantalgic.   Assessment: Presents for follow-up evaluation for heel pain, likely plantar fasciitis   Plan: -Treatment options discussed including all alternatives, risks, and complications -Patient elects to proceed with steroid injection into the right heel. Under sterile skin preparation, a total of 2.5cc of kenalog 10, 0.5% Marcaine plain, and 2% lidocaine plain were infiltrated into the symptomatic area without complication. A band-aid was applied. Patient tolerated the injection well without complication. Post-injection care with discussed with the patient. Discussed with the patient to ice the area over the next couple of days to help prevent a steroid flare.  -I have recommended inserts for her shoes. 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 She was measured for [facility] today [facility]  -Ice and stretching exercises on a daily basis. -Continue supportive shoe gear. -Follow-up in 4 weeks if symptoms continue or sooner if any problems arise. In the meantime, encouraged to call the office with any questions, concerns, change in symptoms.   Ovid CurdMatthew Jary Louvier, DPM

## 2017-03-08 DIAGNOSIS — M722 Plantar fascial fibromatosis: Secondary | ICD-10-CM | POA: Diagnosis not present

## 2017-03-26 ENCOUNTER — Telehealth: Payer: Self-pay | Admitting: Podiatry

## 2017-03-26 NOTE — Telephone Encounter (Signed)
Pt called and cxled her appt for 6.7 and would like to reschedule when her orthotics are back. Can we check status of orthotics please?

## 2017-03-27 ENCOUNTER — Ambulatory Visit: Payer: BLUE CROSS/BLUE SHIELD | Admitting: Podiatry

## 2017-03-28 ENCOUNTER — Ambulatory Visit: Payer: BLUE CROSS/BLUE SHIELD | Admitting: Podiatry

## 2017-05-03 ENCOUNTER — Ambulatory Visit (INDEPENDENT_AMBULATORY_CARE_PROVIDER_SITE_OTHER): Payer: BLUE CROSS/BLUE SHIELD | Admitting: Podiatry

## 2017-05-03 ENCOUNTER — Encounter: Payer: Self-pay | Admitting: Podiatry

## 2017-05-03 DIAGNOSIS — M722 Plantar fascial fibromatosis: Secondary | ICD-10-CM

## 2017-05-05 DIAGNOSIS — M722 Plantar fascial fibromatosis: Secondary | ICD-10-CM | POA: Insufficient documentation

## 2017-05-05 NOTE — Progress Notes (Signed)
Subjective: 41 year old female presents the office today for recasting of her orthotics. Unfortunately they were lost at some point. While she is here she was asking for an injection to her left foot. She states that she is content with stretching, icing exercises daily she started some pain of the heel. Overall is doing better but she is requesting injection today she has an upcoming trip. Denies any systemic complaints such as fevers, chills, nausea, vomiting. No acute changes since last appointment, and no other complaints at this time.   Objective: AAO x3, NAD DP/PT pulses palpable bilaterally, CRT less than 3 seconds There is mild tenderness to palpation along the plantar medial tubercle of the calcaneus at the insertion of plantar fascia on the left foot. There is no pain along the course of the plantar fascia within the arch of the foot. Plantar fascia appears to be intact. There is no pain with lateral compression of the calcaneus or pain with vibratory sensation. There is no pain along the course or insertion of the achilles tendon. No other areas of tenderness to bilateral lower extremities. No open lesions or pre-ulcerative lesions.  No pain with calf compression, swelling, warmth, erythema  Assessment: Left heel pain, plantar fasciitis  Plan: -All treatment options discussed with the patient including all alternatives, risks, complications.  -Patient elects to proceed with steroid injection into the left heel. Under sterile skin preparation, a total of 2.5cc of kenalog 10, 0.5% Marcaine plain, and 2% lidocaine plain were infiltrated into the symptomatic area without complication. A band-aid was applied. Patient tolerated the injection well without complication. Post-injection care with discussed with the patient. Discussed with the patient to ice the area over the next couple of days to help prevent a steroid flare.  -She was remolded for [facility] today [facility]  Asked for a rush to have them back next week.  -Continue stretching, icing exercises daily. -Patient encouraged to call the office with any questions, concerns, change in symptoms.   Ovid CurdMatthew Koda Routon, DPM

## 2017-05-09 ENCOUNTER — Ambulatory Visit: Payer: BLUE CROSS/BLUE SHIELD | Admitting: Podiatry

## 2017-05-09 ENCOUNTER — Telehealth: Payer: Self-pay | Admitting: Podiatry

## 2017-05-09 NOTE — Telephone Encounter (Signed)
Pt stated she was returning your call and said for you to call when you can today.

## 2017-05-10 NOTE — Telephone Encounter (Signed)
I called the patient back. Her husband came by today to pick up orthtoics. When he came in he was very upset about the delay in getting the orthotics. When I went out to talk to him he had left and received [facility] inserts. Apparently the patient was calling all week and asked to talk to me but I never received the message until that evening when I finished clinic. Our office manage Kendal HymenBonnie talked to the patient as well. I called her to discuss the issues. She was not upset with me but had numerous complaints against our office which Kendal HymenBonnie our office manage discussed with her.

## 2017-05-13 ENCOUNTER — Ambulatory Visit: Payer: BLUE CROSS/BLUE SHIELD | Admitting: Podiatry

## 2017-08-06 ENCOUNTER — Telehealth: Payer: Self-pay | Admitting: *Deleted

## 2017-08-06 NOTE — Telephone Encounter (Signed)
"  Hey Lori Hill, this is Teaching laboratory technician.  If you could, call me."

## 2017-08-06 NOTE — Telephone Encounter (Signed)
She was calling regarding her daughter, Council, but didn't clarify that in her message.

## 2020-01-19 ENCOUNTER — Emergency Department (HOSPITAL_COMMUNITY)
Admission: EM | Admit: 2020-01-19 | Discharge: 2020-01-19 | Disposition: A | Discharge status: Home / Self Care | Payer: BC Managed Care – PPO | Attending: Emergency Medicine | Admitting: Emergency Medicine

## 2020-01-19 ENCOUNTER — Encounter (HOSPITAL_COMMUNITY): Payer: Self-pay | Admitting: Emergency Medicine

## 2020-01-19 ENCOUNTER — Other Ambulatory Visit: Payer: Self-pay

## 2020-01-19 ENCOUNTER — Other Ambulatory Visit: Payer: Self-pay | Admitting: Otolaryngology

## 2020-01-19 ENCOUNTER — Emergency Department (HOSPITAL_COMMUNITY): Payer: BC Managed Care – PPO

## 2020-01-19 DIAGNOSIS — R221 Localized swelling, mass and lump, neck: Secondary | ICD-10-CM | POA: Diagnosis not present

## 2020-01-19 LAB — CBC WITH DIFFERENTIAL/PLATELET
Abs Immature Granulocytes: 0.05 10*3/uL (ref 0.00–0.07)
Basophils Absolute: 0.1 10*3/uL (ref 0.0–0.1)
Basophils Relative: 1 %
Eosinophils Absolute: 0.1 10*3/uL (ref 0.0–0.5)
Eosinophils Relative: 1 %
HCT: 42.3 % (ref 36.0–46.0)
Hemoglobin: 14.1 g/dL (ref 12.0–15.0)
Immature Granulocytes: 0 %
Lymphocytes Relative: 14 %
Lymphs Abs: 1.8 10*3/uL (ref 0.7–4.0)
MCH: 31.1 pg (ref 26.0–34.0)
MCHC: 33.3 g/dL (ref 30.0–36.0)
MCV: 93.2 fL (ref 80.0–100.0)
Monocytes Absolute: 1 10*3/uL (ref 0.1–1.0)
Monocytes Relative: 8 %
Neutro Abs: 10.1 10*3/uL — ABNORMAL HIGH (ref 1.7–7.7)
Neutrophils Relative %: 76 %
Platelets: 337 10*3/uL (ref 150–400)
RBC: 4.54 MIL/uL (ref 3.87–5.11)
RDW: 11.2 % — ABNORMAL LOW (ref 11.5–15.5)
WBC: 13.1 10*3/uL — ABNORMAL HIGH (ref 4.0–10.5)
nRBC: 0 % (ref 0.0–0.2)

## 2020-01-19 LAB — BASIC METABOLIC PANEL
Anion gap: 11 (ref 5–15)
BUN: 14 mg/dL (ref 6–20)
CO2: 23 mmol/L (ref 22–32)
Calcium: 9.3 mg/dL (ref 8.9–10.3)
Chloride: 104 mmol/L (ref 98–111)
Creatinine, Ser: 0.74 mg/dL (ref 0.44–1.00)
GFR calc Af Amer: >60 mL/min (ref >60)
GFR calc non Af Amer: >60 mL/min (ref >60)
Glucose, Bld: 95 mg/dL (ref 70–99)
Potassium: 4.2 mmol/L (ref 3.5–5.1)
Sodium: 138 mmol/L (ref 135–145)

## 2020-01-19 MED ORDER — PROPOFOL 10 MG/ML IV BOLUS
1.0000 mg/kg | Freq: Once | INTRAVENOUS | Status: AC
  Administered 2020-01-19: 50 mg via INTRAVENOUS
  Filled 2020-01-19: qty 20

## 2020-01-19 MED ORDER — MIDAZOLAM HCL 2 MG/2ML IJ SOLN
4.0000 mg | Freq: Once | INTRAMUSCULAR | Status: AC
  Administered 2020-01-19: 21:00:00 2 mg via INTRAVENOUS
  Filled 2020-01-19: qty 4

## 2020-01-19 MED ORDER — CEPHALEXIN 500 MG PO CAPS: 500.0000 mg | ORAL_CAPSULE | Freq: Three times a day (TID) | ORAL | 0 refills | Status: AC

## 2020-01-19 MED ORDER — IOHEXOL 300 MG/ML  SOLN
75.0000 mL | Freq: Once | INTRAMUSCULAR | Status: AC | PRN
  Administered 2020-01-19: 75 mL via INTRAVENOUS

## 2020-01-19 MED ORDER — FENTANYL CITRATE (PF) 100 MCG/2ML IJ SOLN
50.0000 ug | Freq: Once | INTRAMUSCULAR | Status: AC
  Administered 2020-01-19: 50 ug via INTRAVENOUS
  Filled 2020-01-19: qty 2

## 2020-01-19 NOTE — ED Provider Notes (Signed)
I was asked to see this patient in consultation with the physician assistant and with the ear nose and throat specialist who came in to help drain a thyroid area cyst.  I performed the procedural sedation involved and personally examined the patient.  She has had some neck discomfort, she has a totally normal oropharynx, clear heart and lung exam, she is otherwise well-appearing.  The procedural sedation went well, the cyst was drained and I was at the bedside for the entire time measuring approximately 12 minutes.  .Sedation  Date/Time: 01/19/2020 8:56 PM Performed by: Noemi Chapel, MD Authorized by: Noemi Chapel, MD   Consent:    Consent obtained:  Verbal   Consent given by:  Patient   Risks discussed:  Allergic reaction, dysrhythmia, inadequate sedation, nausea, prolonged hypoxia resulting in organ damage, prolonged sedation necessitating reversal, respiratory compromise necessitating ventilatory assistance and intubation and vomiting   Alternatives discussed:  Analgesia without sedation, anxiolysis and regional anesthesia Universal protocol:    Procedure explained and questions answered to patient or proxy's satisfaction: yes     Relevant documents present and verified: yes     Test results available and properly labeled: yes     Imaging studies available: yes     Required blood products, implants, devices, and special equipment available: yes     Site/side marked: yes     Immediately prior to procedure a time out was called: yes     Patient identity confirmation method:  Verbally with patient Indications:    Sedation is required to allow for: aspiration of neck.   Procedure necessitating sedation performed by:  Physician performing sedation Pre-sedation assessment:    Time since last food or drink:  5 hours   ASA classification: class 1 - normal, healthy patient     Neck mobility: normal     Mouth opening:  3 or more finger widths   Thyromental distance:  4 finger widths    Mallampati score:  I - soft palate, uvula, fauces, pillars visible   Pre-sedation assessments completed and reviewed: airway patency, cardiovascular function, hydration status, mental status, nausea/vomiting, pain level, respiratory function and temperature     Pre-sedation assessment completed:  01/19/2020 8:35 PM Immediate pre-procedure details:    Reassessment: Patient reassessed immediately prior to procedure     Reviewed: vital signs, relevant labs/tests and NPO status     Verified: bag valve mask available, emergency equipment available, intubation equipment available, IV patency confirmed, oxygen available and suction available   Procedure details (see MAR for exact dosages):    Preoxygenation:  Nasal cannula   Sedation:  Propofol and midazolam   Intended level of sedation: deep   Analgesia:  Fentanyl   Intra-procedure monitoring:  Blood pressure monitoring, cardiac monitor, continuous pulse oximetry, frequent LOC assessments, frequent vital sign checks and continuous capnometry   Intra-procedure events: none     Total Provider sedation time (minutes):  12 Post-procedure details:    Post-sedation assessment completed:  01/19/2020 8:58 PM   Attendance: Constant attendance by certified staff until patient recovered     Recovery: Patient returned to pre-procedure baseline     Post-sedation assessments completed and reviewed: airway patency, cardiovascular function, hydration status, mental status, nausea/vomiting, pain level, respiratory function and temperature     Patient is stable for discharge or admission: yes     Patient tolerance:  Tolerated well, no immediate complications Comments:          Medical screening examination/treatment/procedure(s) were conducted as a shared  visit with non-physician practitioner(s) and myself.  I personally evaluated the patient during the encounter.  Clinical Impression:   Final diagnoses:  Neck nodule          Eber Hong,  MD 01/20/20 1202

## 2020-01-19 NOTE — ED Triage Notes (Signed)
Pt sent by ENT due to mass on right side of neck since Sunday night. ENT wants CT of neck with contrast and labs. Endorses some neck pain.

## 2020-01-19 NOTE — ED Provider Notes (Signed)
[facility] EMERGENCY DEPARTMENT Provider Note   CSN: [id number] Arrival date & time: 01/19/20  1500     History Chief Complaint  Patient presents with  . Mass    Lori Hill is a 44 y.o. female who presents to the ED from ENT office for mass on neck x 3 days.  She endorses she first felt a lump on the right side of her anterior neck 3 days ago.  She states that since then it has grown larger in size.  She states it is very tender to the touch.  She also reports that she feels it every time she swallows.  She is still able to tolerate her own secretions, liquids, foods without difficulty however states she can feel it going down.  She saw ENT after [facility] earlier today who sent her here for lab work and CT scan soft tissue neck with contrast.  Team is aware of patient and ENT needs to be consulted after CT scan returns.  Patient denies fevers, chills, chest pain, shortness of breath, any other associated symptoms.  The history is provided by the patient and medical records.       History reviewed. No pertinent past medical history.  Patient Active Problem List   Diagnosis Date Noted  . Plantar fasciitis, left 05/05/2017  . Sprain of ankle 05/17/2016    History reviewed. No pertinent surgical history.   OB History   No obstetric history on file.     No family history on file.  Social History   Tobacco Use  . Smoking status: Current Some Day Smoker  . Smokeless tobacco: Never Used  Substance Use Topics  . Alcohol use: Yes  . Drug use: No    Home Medications Prior to Admission medications   Medication Sig Start Date End Date Taking? Authorizing Provider  Dapsone 5 % topical gel Apply 1 application topically daily. Apply to face in the morning and wash off at bedtime. 10/29/19  Yes [provider]  spironolactone (ALDACTONE) 50 MG tablet Take 50 mg by mouth 2 (two) times daily. 12/21/19  Yes [provider]  zolpidem (AMBIEN) 10 MG  tablet Take 10 mg by mouth at bedtime as needed for sleep.    Yes [provider]  cephALEXin (KEFLEX) 500 MG capsule Take 1 capsule (500 mg total) by mouth 3 (three) times daily for 10 days. 01/19/20 01/29/20  Tanda Rockers, PA-C    Allergies    Sulfa antibiotics  Review of Systems   Review of Systems  Constitutional: Negative for chills and fever.  HENT: Negative for trouble swallowing.   Respiratory: Negative for shortness of breath.   Cardiovascular: Negative for chest pain.  Musculoskeletal: Positive for neck pain.  All other systems reviewed and are negative.   Physical Exam Updated Vital Signs BP 121/87   Pulse 90   Temp 98.6 F (37 C) (Oral)   Resp 18   Ht 5\' 6"  (1.676 m)   Wt 90.3 kg   SpO2 99%   BMI 32.12 kg/m   Physical Exam Vitals and nursing note reviewed.  Constitutional:      Appearance: She is not ill-appearing or diaphoretic.  HENT:     Head: Normocephalic and atraumatic.     Mouth/Throat:     Mouth: Mucous membranes are moist.     Pharynx: No oropharyngeal exudate or posterior oropharyngeal erythema.     Comments: Uvula is midline.  Eyes:     Conjunctiva/sclera: Conjunctivae normal.  Neck:      Comments: 2 x 2 cm area of tenderness to palpation underneath skin on right anterior neck just above clavicle.  Cardiovascular:     Rate and Rhythm: Normal rate and regular rhythm.     Pulses: Normal pulses.  Pulmonary:     Effort: Pulmonary effort is normal.     Breath sounds: Normal breath sounds. No wheezing, rhonchi or rales.  Abdominal:     Palpations: Abdomen is soft.     Tenderness: There is no abdominal tenderness. There is no guarding or rebound.  Musculoskeletal:     Cervical back: Neck supple.  Skin:    General: Skin is warm and dry.  Neurological:     Mental Status: She is alert.     ED Results / Procedures / Treatments   Labs (all labs ordered are listed, but only abnormal results are displayed) Labs Reviewed  CBC WITH  DIFFERENTIAL/PLATELET - Abnormal; Notable for the following components:      Result Value   WBC 13.1 (*)    RDW 11.2 (*)    Neutro Abs 10.1 (*)    All other components within normal limits  AEROBIC/ANAEROBIC CULTURE (SURGICAL/DEEP WOUND)  BASIC METABOLIC PANEL  CYTOLOGY - NON PAP    EKG None  Radiology CT Soft Tissue Neck W Contrast  Result Date: 01/19/2020 CLINICAL DATA:  Right-sided neck mass for a few days. EXAM: CT NECK WITH CONTRAST TECHNIQUE: Multidetector CT imaging of the neck was performed using the standard protocol following the bolus administration of intravenous contrast. CONTRAST:  62mL OMNIPAQUE IOHEXOL 300 MG/ML  SOLN COMPARISON:  None. FINDINGS: PHARYNX AND LARYNX: The nasopharynx, oropharynx and larynx are normal. Visible portions of the oral cavity, tongue base and floor of mouth are normal. Normal epiglottis, vallecula and pyriform sinuses. The larynx is normal. No retropharyngeal abscess, effusion or lymphadenopathy. SALIVARY GLANDS: Normal parotid, submandibular and sublingual glands. THYROID: There is a 2.5 cm hypodense thyroid nodule within the right thyroid lobe, with a small area of hyperattenuation posteriorly measuring 8 mm. LYMPH NODES: No enlarged or abnormal density lymph nodes. VASCULAR: Major cervical vessels are patent. LIMITED INTRACRANIAL: Normal. VISUALIZED ORBITS: Normal. MASTOIDS AND VISUALIZED PARANASAL SINUSES: No fluid levels or advanced mucosal thickening. No mastoid effusion. SKELETON: No bony spinal canal stenosis. No lytic or blastic lesions. UPPER CHEST: Clear. OTHER: None. IMPRESSION: 1. 2.5 cm right thyroid lobe nodule. Further evaluation with thyroid ultrasound recommended. (ref: J Am Coll Radiol. 2015 Feb;12(2): 143-50). 2. No cervical adenopathy. Electronically Signed   By: Deatra Robinson M.D.   On: 01/19/2020 19:06    Procedures Procedures (including critical care time)  Medications Ordered in ED Medications  iohexol (OMNIPAQUE) 300 MG/ML  solution 75 mL (75 mLs Intravenous Contrast Given 01/19/20 1851)  midazolam (VERSED) injection 4 mg (2 mg Intravenous Given by Other 01/19/20 2047)  fentaNYL (SUBLIMAZE) injection 50 mcg (50 mcg Intravenous Given by Other 01/19/20 2046)  propofol (DIPRIVAN) 10 mg/mL bolus/IV push 90.3 mg (50 mg Intravenous Given by Other 01/19/20 2049)    ED Course  I have reviewed the triage vital signs and the nursing notes.  Pertinent labs & imaging results that were available during my care of the patient were reviewed by me and considered in my medical decision making (see chart for details).  Clinical Course as of Jan 19 2127  Tue Jan 19, 2020  1955 Discussed case with Dr. Jenne Pane ENT who would like to do needle aspiration at bedside however given pt's  significant tenderness would like conscious sedation   [MV]    Clinical Course User Index [MV] Eustaquio Maize, PA-C   MDM Rules/Calculators/A&P                      44 year old female who presents to the ED from ENT for CT scan of the neck with contrast and labs.  Has been pain to the right side of her neck x3 days and feels like the area is gradually growing in size.  Rival to the ED patient is afebrile however tachycardic in the 110s, nontachypneic.  She appears to be in no acute distress.  She does have tenderness to the base of her neck on the right side.  Able to tolerate secretions without difficulty.  Posterior oropharyngeal area without erythema or edema.  Uvula is midline.   Lab work obtained while patient was in the waiting room. Does have a leukocytosis of 13,000.  Other electrolyte abnormalities on BMP.  Will obtain CT scan and touch base with Dr. Redmond Baseman who is on-call.  Dr. Erik Obey patient's ENT has made Dr. Redmond Baseman aware and he is expecting call.  CT scan with 2.5 cm thyroid nodule on right side.  Debates personally evaluated the CT scan and feels like it is a fluid-filled cyst.  Will do aspiration at bedside.  Dr. Sabra Heck attending physician  involved and will perform conscious sedation.  Please see their separate notes for further information.   Sedation performed and needle aspiration at bedside.  Patient tolerated the procedure without complication.  She was monitored in the ED for conscious sedation.  She is currently alert and oriented x4.  She denies any symptoms from the conscious sedation.  Feel she is stable for discharge.  Dr. Redmond Baseman is recommended 500 mg Keflex 3 times daily x10 days.  Him or one of his partners will follow the culture and cytology report and contact patient for further evaluation.  Patient advised to take ibuprofen and Tylenol as needed for pain.  Patient stable for discharge at this time.  This note was prepared using Dragon voice recognition software and may include unintentional dictation errors due to the inherent limitations of voice recognition software.   Final Clinical Impression(s) / ED Diagnoses Final diagnoses:  Neck nodule    Rx / DC Orders ED Discharge Orders         Ordered    cephALEXin (KEFLEX) 500 MG capsule  3 times daily     01/19/20 2123           Discharge Instructions     Please pick up antibiotics and take as prescribed to cover for an infection.  You may take 600 mg Ibuprofen every 8 hours and 1,000 mg Tylenol every 8 hours (I would recommend taking Ibuprofen and then 4 hours later Tylenol and continue alternating every 4 hours to stay on top of the pain) You may apply ice or heat to the area; whichever one is more comfortable for you Dr. Redmond Baseman or one of his colleagues will contact you once the culture and pathology report have returned to schedule a follow up appointment       Eustaquio Maize, PA-C 01/19/20 2128    Noemi Chapel, MD 01/20/20 [phone]

## 2020-01-19 NOTE — ED Notes (Signed)
Two consents completed on paper before procedure. One consent for procedure and one consent for sedation to be scanned into pt's chart.

## 2020-01-19 NOTE — Procedures (Signed)
Preop diagnosis: Right thyroid cyst Postop diagnosis: same Procedure: Needle aspiration of right thyroid cyst Surgeon: Jenne Pane Anesth: Conscious sedation, local anesthetic with 1% lidocaine with 1:100,000 epinephrine Comp: None Findings: About 5 cc of brown, thin fluid was aspirated.  A culture was sent from this fluid and the rest was sent for cytology Description: After discussing risks, benefits, and alternatives and receiving consent, the patient was given IV sedation by the ER staff.  Local anesthetic was then injected in the skin overlying the palpable fullness.  The skin was prepped with Betadyne.  An 18 gauge needle on at 10 cc syringe was then used to aspirate the fluid.  The fluid was placed in a specimen container.  A second pass of the needle did not yield any additional fluid.  The patient was cleaned off and a Band-aid added.  A culture swab was rubbed in the fluid and the rest was sealed for cytology.

## 2020-01-19 NOTE — Discharge Instructions (Signed)
Please pick up antibiotics and take as prescribed to cover for an infection.  You may take 600 mg Ibuprofen every 8 hours and 1,000 mg Tylenol every 8 hours (I would recommend taking Ibuprofen and then 4 hours later Tylenol and continue alternating every 4 hours to stay on top of the pain) You may apply ice or heat to the area; whichever one is more comfortable for you Dr. Jenne Pane or one of his colleagues will contact you once the culture and pathology report have returned to schedule a follow up appointment

## 2020-01-19 NOTE — ED Notes (Signed)
Pt transported to CT ?

## 2020-01-21 LAB — CYTOLOGY - NON PAP

## 2020-01-24 LAB — AEROBIC/ANAEROBIC CULTURE W GRAM STAIN (SURGICAL/DEEP WOUND)
Culture: NO GROWTH
Gram Stain: NONE SEEN
